# Patient Record
Sex: Male | Born: 1950 | Race: White | Hispanic: No | Marital: Married | Smoking: Current every day smoker
Health system: Southern US, Community
[De-identification: ages and names within clinical notes are randomized; demographics above are authoritative.]

## PROBLEM LIST (undated history)

## (undated) DIAGNOSIS — E119 Type 2 diabetes mellitus without complications: Secondary | ICD-10-CM

## (undated) DIAGNOSIS — H269 Unspecified cataract: Secondary | ICD-10-CM

## (undated) HISTORY — DX: Type 2 diabetes mellitus without complications: E11.9

## (undated) HISTORY — DX: Unspecified cataract: H26.9

## (undated) HISTORY — PX: EYE SURGERY: SHX253

---

## 2014-12-06 ENCOUNTER — Ambulatory Visit: Payer: Worker's Compensation

## 2014-12-06 ENCOUNTER — Ambulatory Visit (INDEPENDENT_AMBULATORY_CARE_PROVIDER_SITE_OTHER): Payer: Worker's Compensation | Admitting: Emergency Medicine

## 2014-12-06 VITALS — BP 120/84 | HR 97 | Temp 98.0°F | Resp 18 | Ht 73.0 in | Wt 198.0 lb

## 2014-12-06 DIAGNOSIS — S32019A Unspecified fracture of first lumbar vertebra, initial encounter for closed fracture: Secondary | ICD-10-CM | POA: Diagnosis not present

## 2014-12-06 DIAGNOSIS — M549 Dorsalgia, unspecified: Secondary | ICD-10-CM

## 2014-12-06 MED ORDER — HYDROCODONE-ACETAMINOPHEN 5-325 MG PO TABS: 1.0000 | ORAL_TABLET | Freq: Four times a day (QID) | ORAL | Status: AC | PRN

## 2014-12-06 NOTE — Patient Instructions (Signed)
  No left over 10 pounds no flexion at the waist. Okay to do sedentary type work.  Vertebral Fracture  You have a fracture of one or more vertebra. These are the bony parts that form the spine. Minor vertebral fractures happen when people fall. Osteoporosis is associated with many of these fractures. Hospital care may not be necessary for minor compression fractures that are stable. However, multiple fractures of the spine or unstable injuries can cause severe pain and even damage the spinal cord. A spinal cord injury may cause paralysis, numbness, or loss of normal bowel and bladder control.  Normally there is pain and stiffness in the back for 3 to 6 weeks after a vertebral fracture. Bed rest for several days, pain medicine, and a slow return to activity is often the only treatment that is needed depending on the location of the fracture. Neck and back braces may be helpful in reducing pain and increasing mobility. When your pain allows, you should begin walking or swimming to help maintain your endurance. Exercises to improve motion and to strengthen the back may also be useful after the initial pain improves. Treatment for osteoporosis may be essential for full recovery. This will help reduce your risk of vertebral fractures with a future fall. During the first few days after a spine fracture you may feel nauseated or vomit. If this is severe, hospital care with IV fluids will be needed.  Arrange for follow-up care as recommended to assure proper long-term care and prevention of further spine injury.  SEEK IMMEDIATE MEDICAL CARE IF:  You have increasing pain, vomiting, or are unable to move around at all.  You develop numbness, tingling, weakness, or paralysis of any part of your body.  You develop a loss of normal bowel or bladder control.  You have difficulty breathing, cough, fever, chest or abdominal pain. MAKE SURE YOU:   Understand these instructions.  Will watch your condition.  Will  get help right away if you are not doing well or get worse. Document Released: 07/10/2004 Document Revised: 08/25/2011 Document Reviewed: 01/23/2009 [facility] Patient Information 2015 [place], [place]. This information is not intended to replace advice given to you by your health care provider. Make sure you discuss any questions you have with your health care provider.

## 2014-12-06 NOTE — Progress Notes (Signed)
   Subjective:   This chart was scribed for [name], MD by Andrew Au, ED Scribe. This patient was seen in room 14 and the patient's care was started at 10:30 AM.  Patient ID: Kyle Gilmore, male    DOB: 09/27/1950, 64 y.o.   MRN: [id number]  HPI Chief Complaint  Patient presents with  . 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 Back Pain   HPI Comments: Kyle Gilmore is a 64 y.o. male who presents to the [facility] complaining of low back pain. 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 Pt now has localized, dull, low back pain. He reports pain only with movement and when lying down. Pt has been using muscle relaxers and non narcotic pain medication. Pt denies numbness, tingling and weakness in legs. He reports hx of neuropathy. Pt reports hx of back pain about 20 years ago.   Past Medical History  Diagnosis Date  . Cataract   . Diabetes mellitus without complication    Past Surgical History  Procedure Laterality Date  . Eye surgery     Prior to Admission medications   Medication Sig Start Date End Date Taking? Authorizing Provider  Alogliptin-Pioglitazone 25-30 MG TABS Take 25-30 mg by mouth daily.   Yes Historical Provider, MD  Canagliflozin-Metformin HCl (INVOKAMET) [phone] MG TABS Take 150-1,000 mg by mouth 2 (two) times daily.   Yes Historical Provider, MD  Chlorzoxazone (LORZONE) 750 MG TABS Take 750 mg by mouth 3 (three) times daily.   Yes Historical Provider, MD  gabapentin (NEURONTIN) 600 MG tablet Take 600 mg by mouth 2 (two) times daily.   Yes Historical Provider, MD  traMADol (ULTRAM) 50 MG tablet Take 50 mg by mouth every 6 (six) hours as needed.   Yes Historical Provider, MD   Review of Systems  Musculoskeletal: Positive for myalgias and back pain.  Neurological: Negative for weakness and numbness.   Objective:   Physical  Exam CONSTITUTIONAL: Well developed/well nourished HEAD: Normocephalic/atraumatic EYES: EOMI/PERRL ENMT: Mucous membranes moist NECK: supple no meningeal signs SPINE/BACK:entire spine nontender CV: S1/S2 noted, no murmurs/rubs/gallops noted LUNGS: Lungs are clear to auscultation bilaterally, no apparent distress ABDOMEN: soft, nontender, no rebound or guarding, bowel sounds noted throughout abdomen GU:no cva tenderness NEURO: Pt is awake/alert/appropriate, moves all extremitiesx4.  No facial droop.  1+ knee reflexes absent ankle reflexes EXTREMITIES: pulses normal/equal, full ROM. Minimal tenderness to lumbar spine SKIN: warm, color normal PSYCH: no abnormalities of mood noted, alert and oriented to situation  Filed Vitals:   12/06/14 0953  BP: 120/84  Pulse: 97  Temp: 98 F (36.7 C)  TempSrc: Oral  Resp: 18  Height: 6\' 1"  (1.854 m)  Weight: 198 lb (89.812 kg)  SpO2: 98%   [facility] reading (PRIMARY) by Dr. Cleta Alberts Back Xray please comment. There appears to be mid body compression fracture of L1. Is there any retropulsion in this area. There is scoliosis noted  Assessment & Plan:

## 2016-01-27 IMAGING — CR DG LUMBAR SPINE COMPLETE 4+V
6 series · 6 of 6 positions shown · non-contrast
Comparison: None.

CLINICAL DATA: Back pain.  Lifting injury.

EXAM:
LUMBAR SPINE - COMPLETE 4+ VIEW

[AP (1 of 2)]
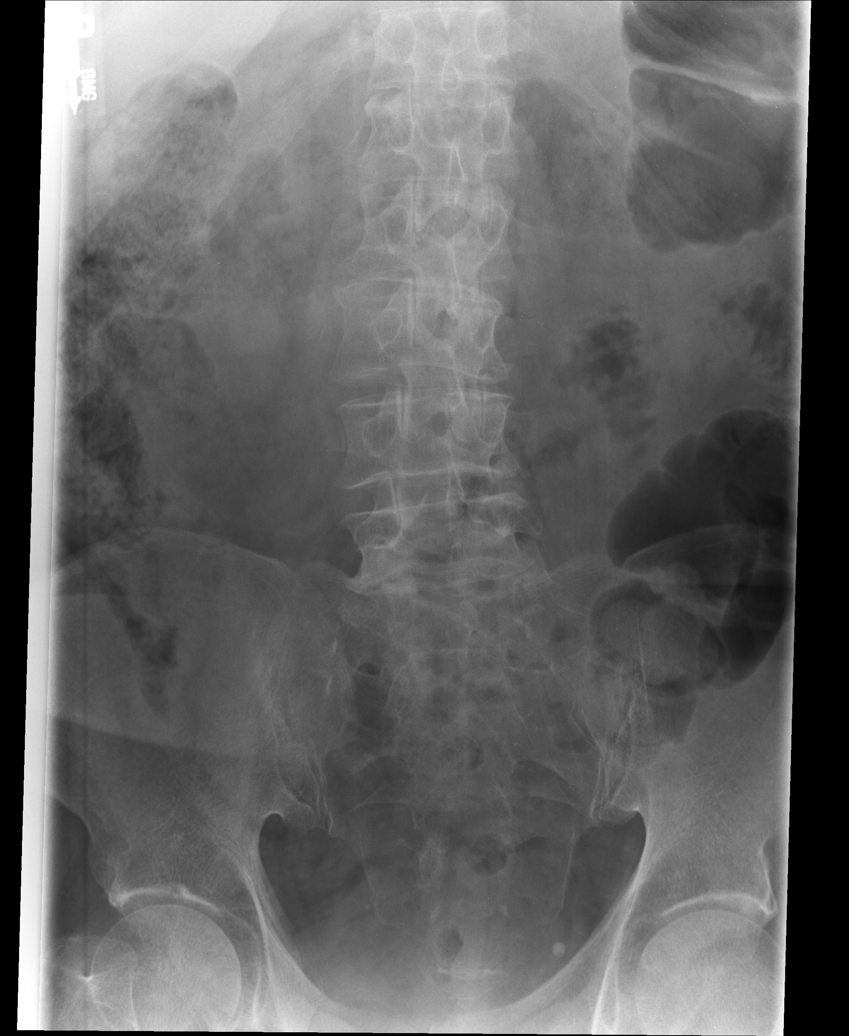

[AP (2 of 2)]
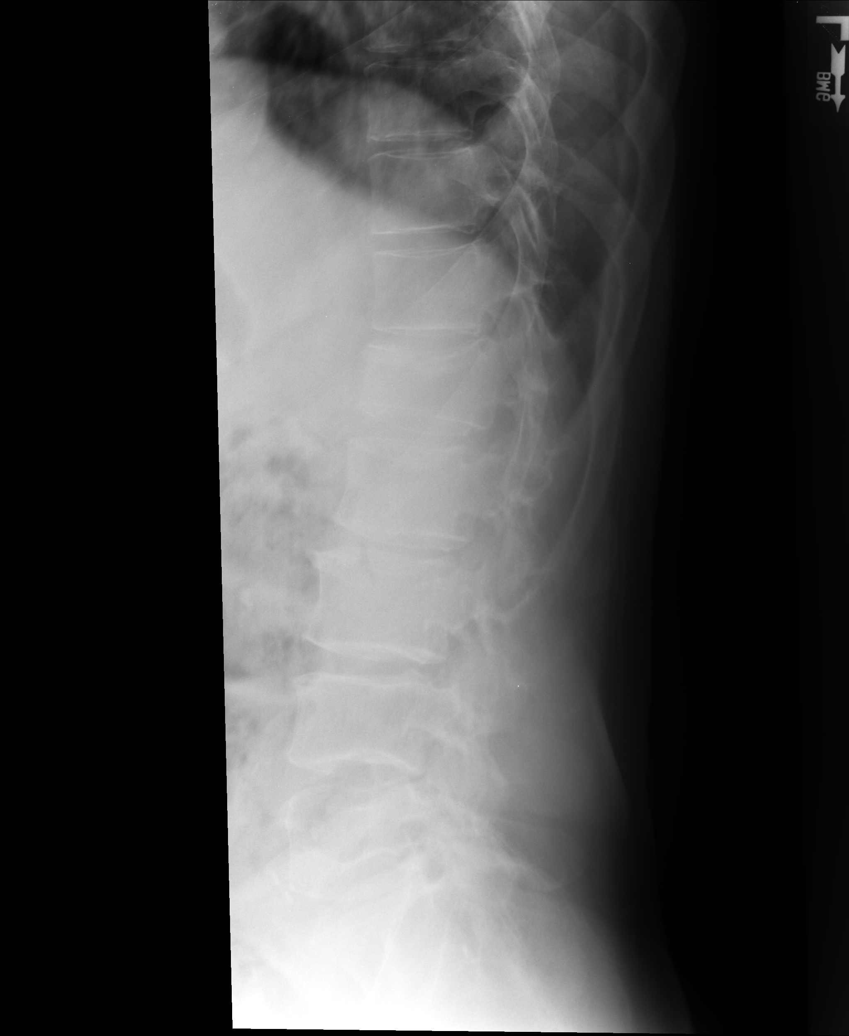

[l5 s1]
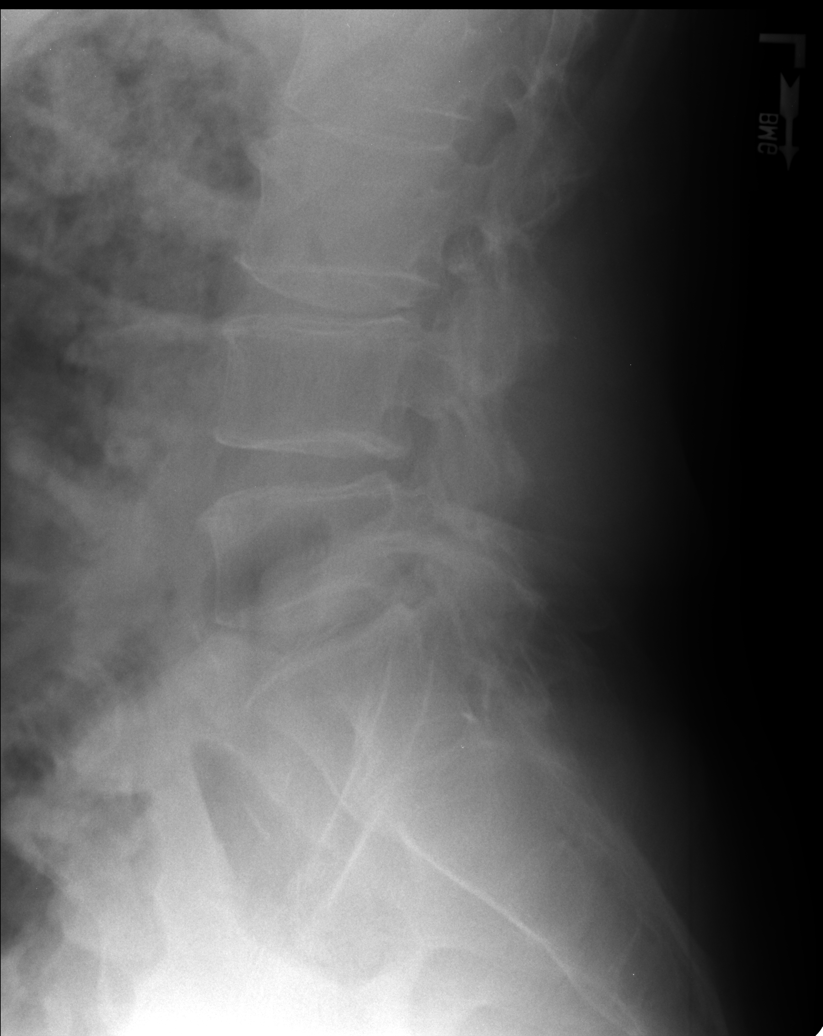

[rpo (1 of 2)]
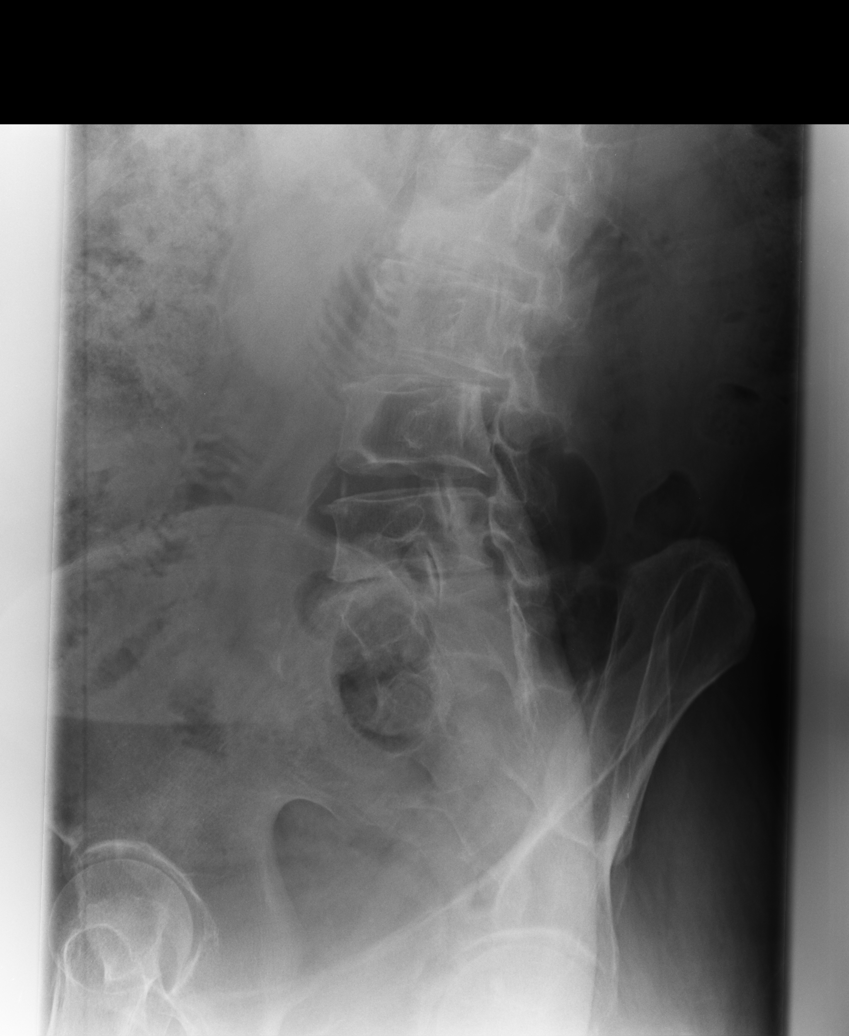

[lpo]
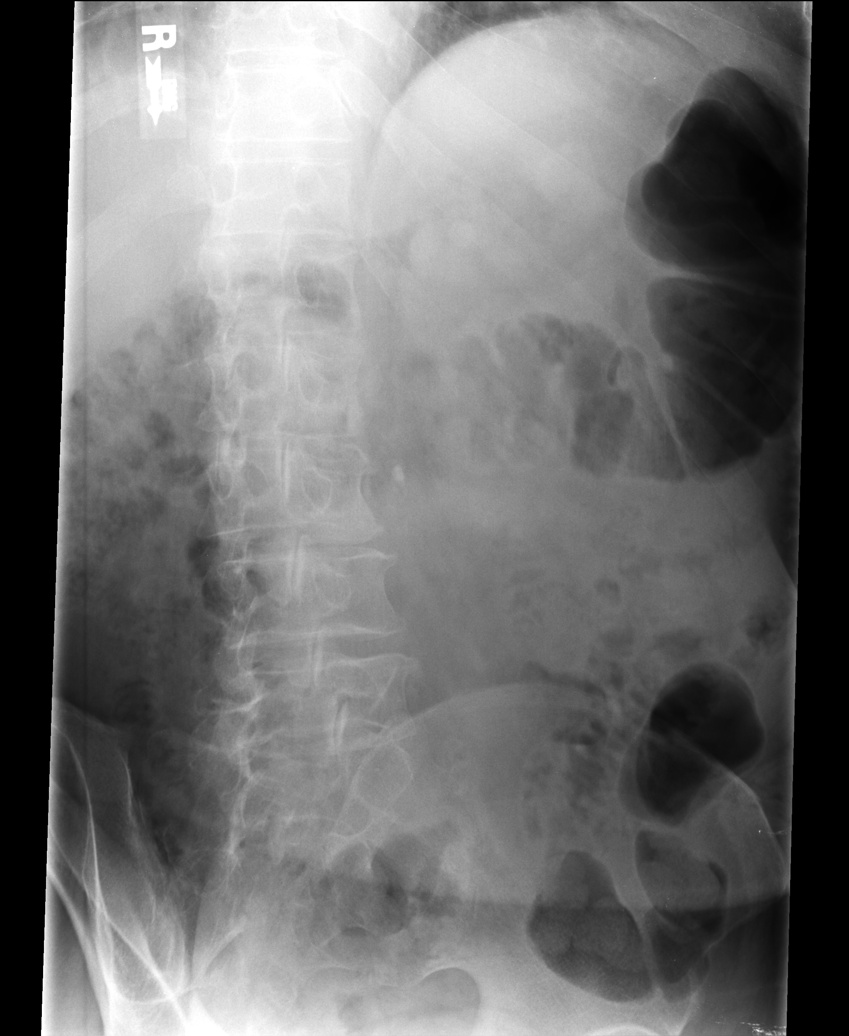

[rpo (2 of 2)]
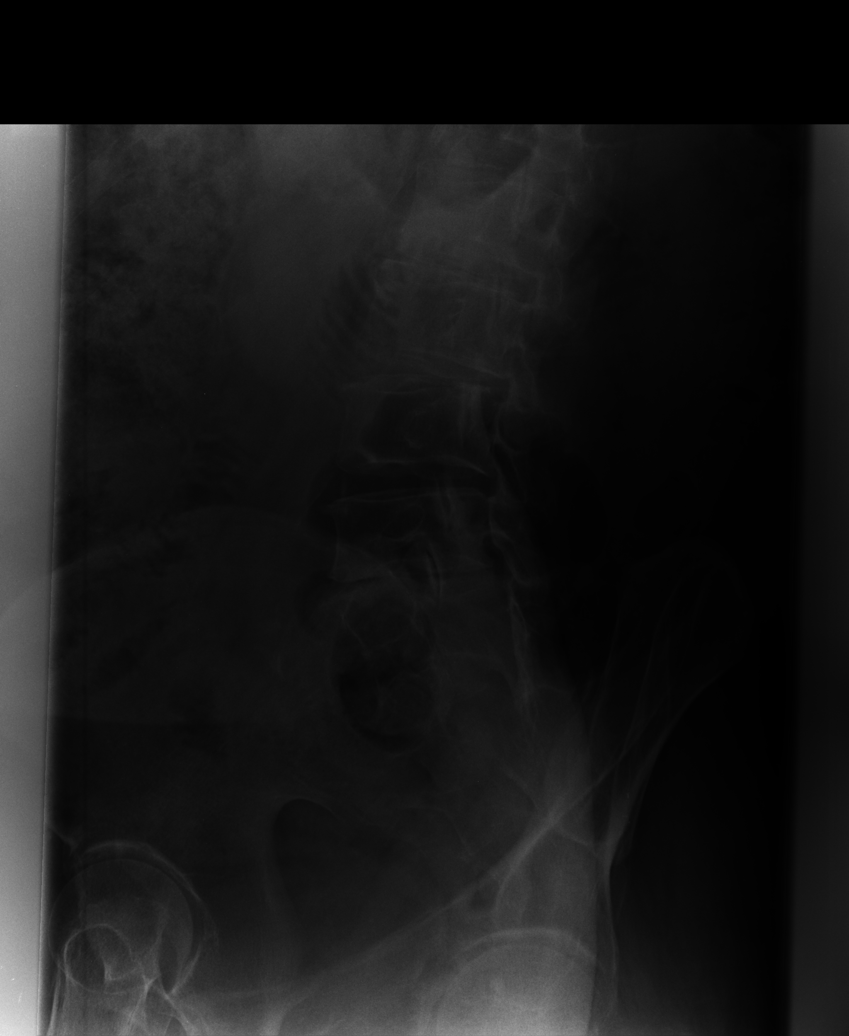

[6 of 6 positions shown; findings below may reference images not displayed]

FINDINGS: Paraspinal soft tissues are normal. Diffuse degenerative change
lumbar spine. L1 mild compression fractures noted. Age is
undetermined.
IMPRESSION: 1.  L1 mild compression fracture, age undetermined.

2.  Diffuse degenerative changes lumbar spine.

3.  Aortoiliac atherosclerotic vascular disease .

## 2016-09-14 DEATH — deceased
